# Patient Record
Sex: Female | Born: 1965 | Race: White | Hispanic: No | Marital: Married
Health system: Southern US, Community
[De-identification: ages and names within clinical notes are randomized; demographics above are authoritative.]

---

## 1998-04-28 ENCOUNTER — Other Ambulatory Visit: Admission: RE | Admit: 1998-04-28 | Discharge: 1998-04-28 | Payer: Self-pay | Admitting: Physician Assistant

## 1999-05-16 ENCOUNTER — Other Ambulatory Visit: Admission: RE | Admit: 1999-05-16 | Discharge: 1999-05-16 | Payer: Self-pay | Admitting: Obstetrics and Gynecology

## 2000-08-06 ENCOUNTER — Other Ambulatory Visit: Admission: RE | Admit: 2000-08-06 | Discharge: 2000-08-06 | Payer: Self-pay | Admitting: Obstetrics and Gynecology

## 2001-08-12 ENCOUNTER — Other Ambulatory Visit: Admission: RE | Admit: 2001-08-12 | Discharge: 2001-08-12 | Payer: Self-pay | Admitting: Obstetrics and Gynecology

## 2002-09-15 ENCOUNTER — Other Ambulatory Visit: Admission: RE | Admit: 2002-09-15 | Discharge: 2002-09-15 | Payer: Self-pay | Admitting: Obstetrics and Gynecology

## 2003-09-28 ENCOUNTER — Other Ambulatory Visit: Admission: RE | Admit: 2003-09-28 | Discharge: 2003-09-28 | Payer: Self-pay | Admitting: Obstetrics and Gynecology

## 2004-10-18 ENCOUNTER — Other Ambulatory Visit: Admission: RE | Admit: 2004-10-18 | Discharge: 2004-10-18 | Payer: Self-pay | Admitting: Obstetrics and Gynecology

## 2010-12-11 ENCOUNTER — Encounter: Payer: Self-pay | Admitting: Obstetrics and Gynecology

## 2011-12-13 ENCOUNTER — Other Ambulatory Visit: Payer: Self-pay | Admitting: Obstetrics and Gynecology

## 2011-12-13 DIAGNOSIS — R928 Other abnormal and inconclusive findings on diagnostic imaging of breast: Secondary | ICD-10-CM

## 2011-12-20 ENCOUNTER — Ambulatory Visit
Admission: RE | Admit: 2011-12-20 | Discharge: 2011-12-20 | Disposition: A | Discharge status: Home / Self Care | Payer: BC Managed Care – PPO | Source: Ambulatory Visit | Attending: Obstetrics and Gynecology | Admitting: Obstetrics and Gynecology

## 2011-12-20 DIAGNOSIS — R928 Other abnormal and inconclusive findings on diagnostic imaging of breast: Secondary | ICD-10-CM

## 2012-06-26 ENCOUNTER — Other Ambulatory Visit: Payer: Self-pay | Admitting: Obstetrics and Gynecology

## 2012-06-26 DIAGNOSIS — N63 Unspecified lump in unspecified breast: Secondary | ICD-10-CM

## 2012-08-21 ENCOUNTER — Other Ambulatory Visit: Payer: BC Managed Care – PPO

## 2012-09-20 ENCOUNTER — Other Ambulatory Visit: Payer: BC Managed Care – PPO

## 2012-11-29 ENCOUNTER — Other Ambulatory Visit: Payer: BC Managed Care – PPO

## 2012-12-02 ENCOUNTER — Other Ambulatory Visit: Payer: Self-pay | Admitting: Obstetrics and Gynecology

## 2012-12-02 ENCOUNTER — Ambulatory Visit
Admission: RE | Admit: 2012-12-02 | Discharge: 2012-12-02 | Disposition: A | Discharge status: Home / Self Care | Payer: PRIVATE HEALTH INSURANCE | Source: Ambulatory Visit | Attending: Obstetrics and Gynecology | Admitting: Obstetrics and Gynecology

## 2012-12-02 DIAGNOSIS — N63 Unspecified lump in unspecified breast: Secondary | ICD-10-CM

## 2015-01-10 IMAGING — MG MM DIGITAL DIAGNOSTIC BILAT
4 series · 4 of 4 positions shown · non-contrast
Comparison: With priors

CLINICAL DATA: Follow-up of probable benign lesions in both breast

DIGITAL DIAGNOSTIC BILATERAL MAMMOGRAM WITH CAD

[R CC]
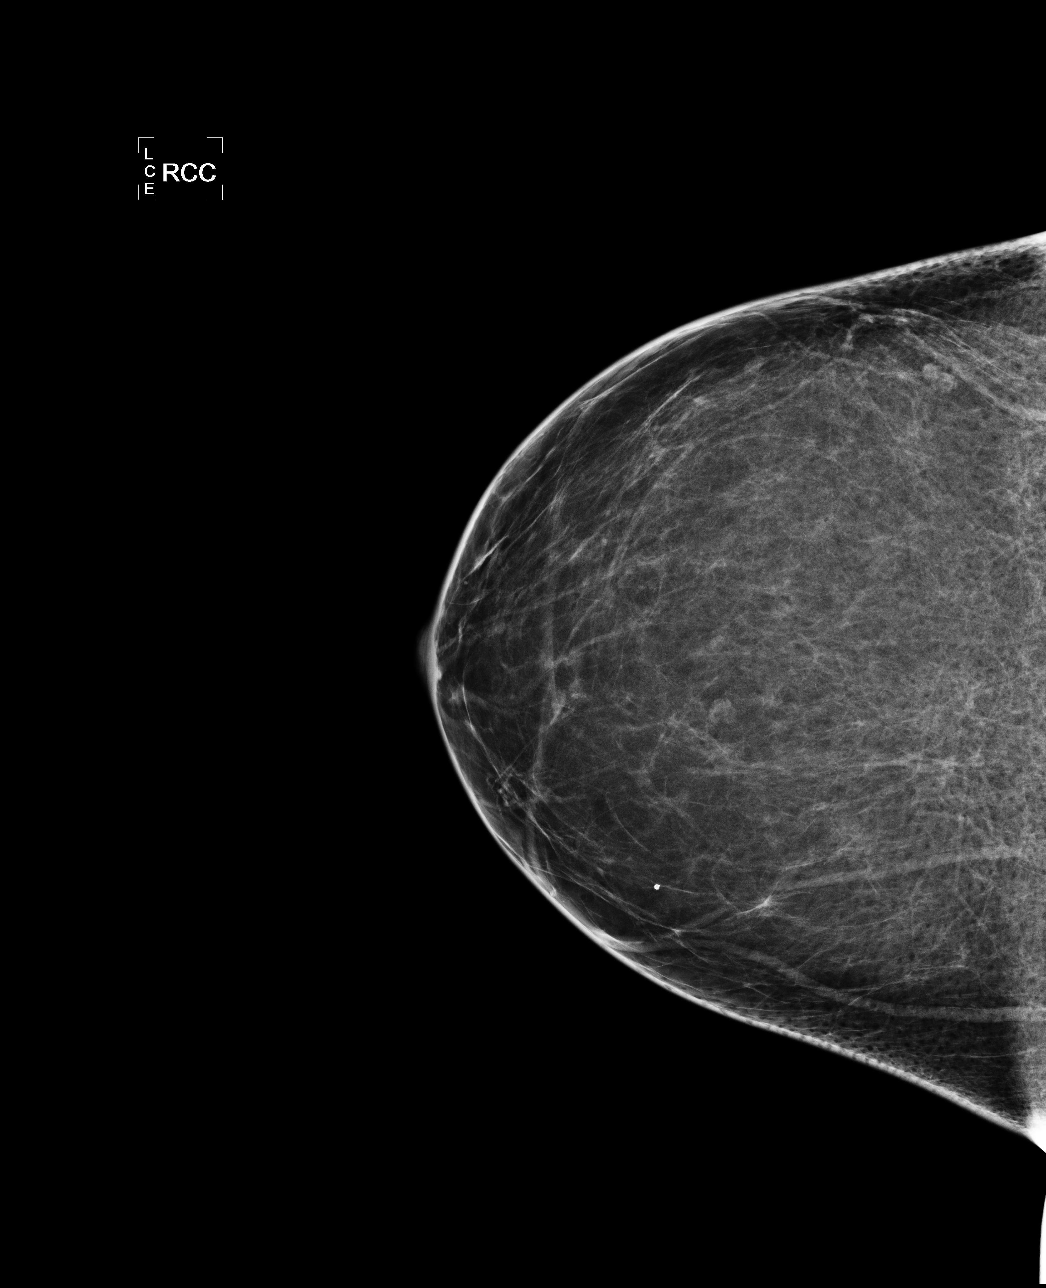

[L CC]
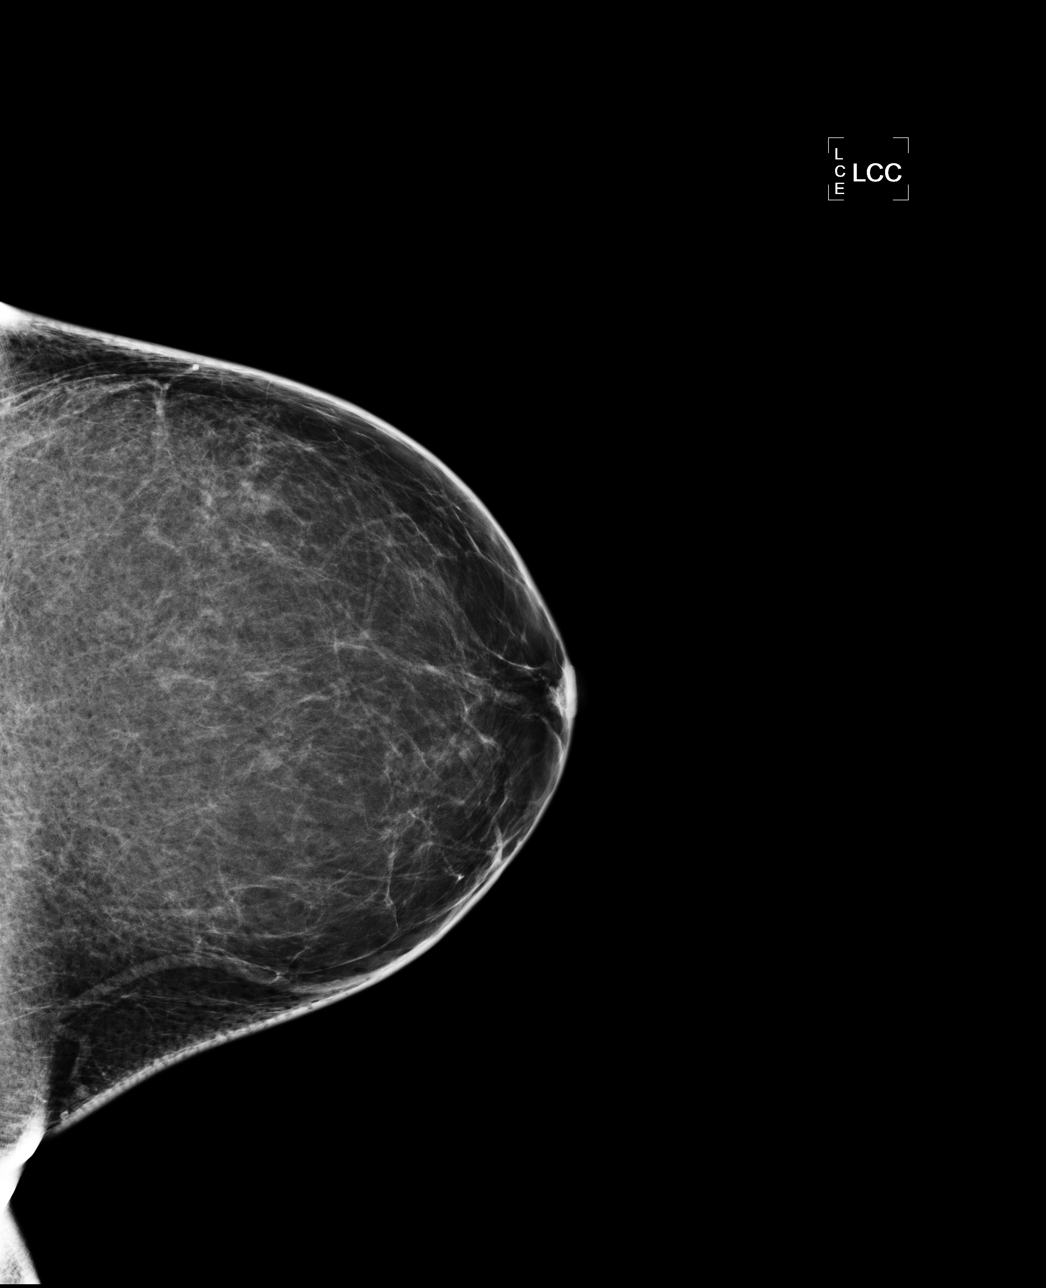

[L MLO]
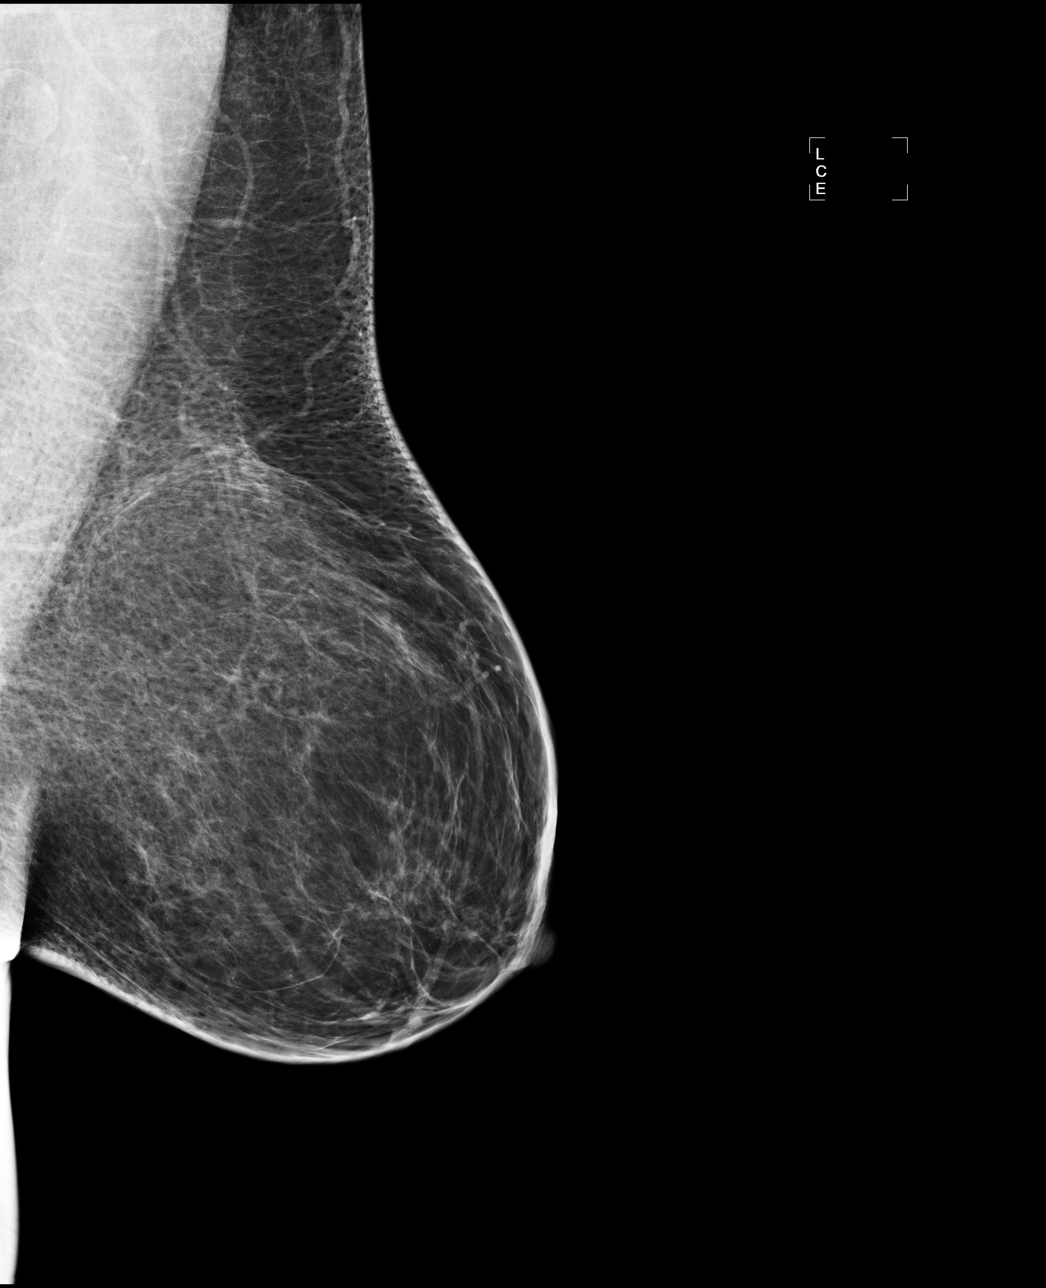

[R MLO]
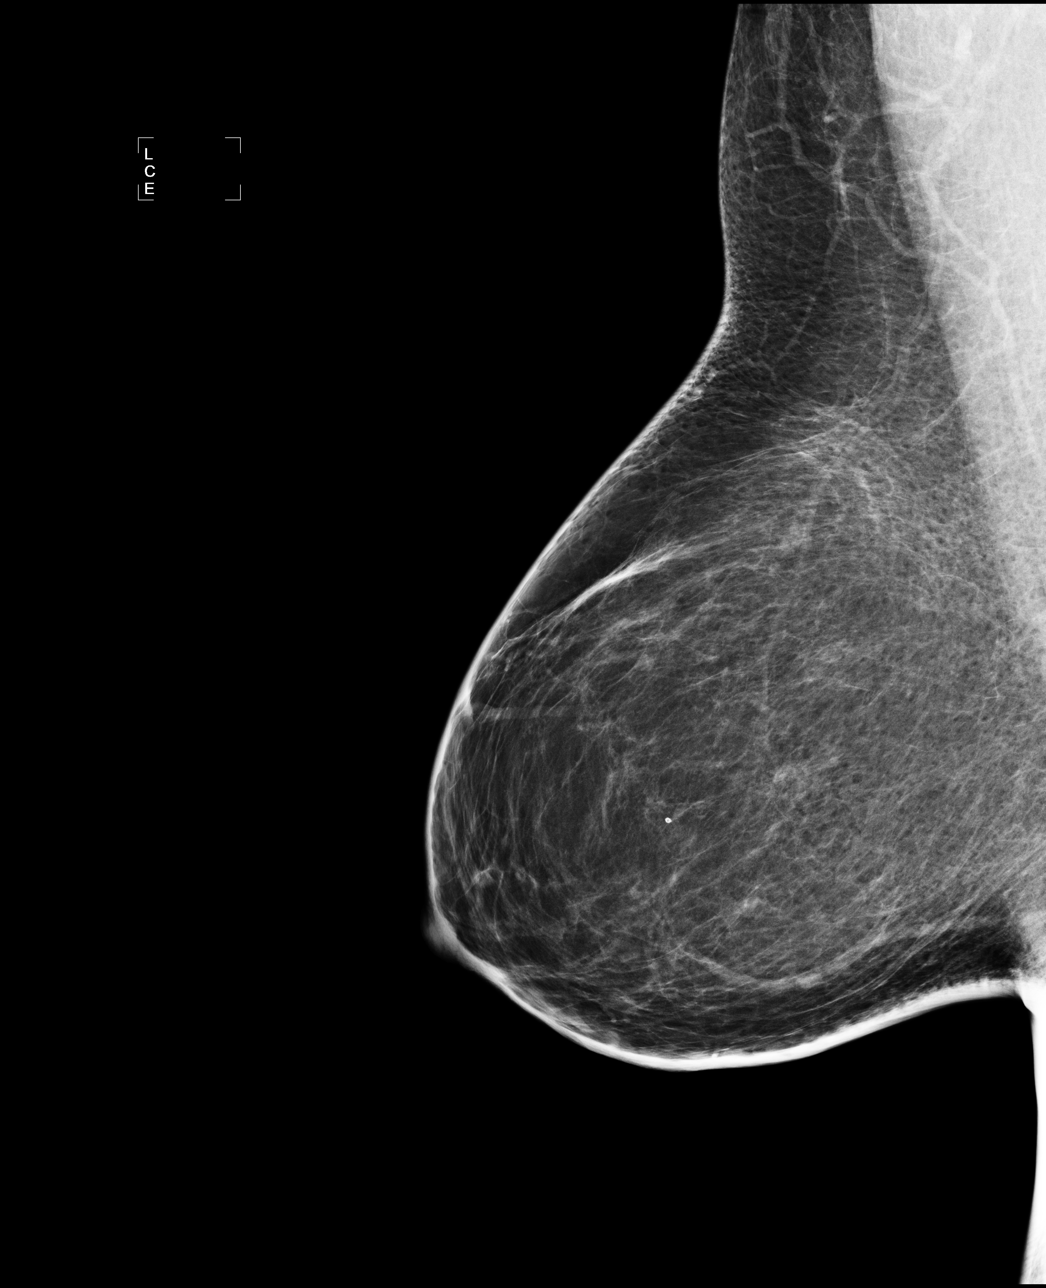

[4 of 4 positions shown; findings below may reference images not displayed]

FINDINGS: ACR Breast Density Category 1: The breast tissue is almost entirely
fatty.

There is no new suspicious mass or malignant-type
microcalcifications in either breast.  The low density nodules
previously described of the medial aspect of the right breast and
lateral aspect of the left breast are less apparent on today's
images.  They have a benign appearance.

Mammographic images were processed with CAD.
IMPRESSION: No evidence of malignancy in either breast.

RECOMMENDATION:
Screening mammogram in 1 year is recommended.

I have discussed the findings and recommendations with the patient.
Results were also provided in writing at the conclusion of the
visit.

BI-RADS CATEGORY 2:  Benign finding(s).

## 2022-01-06 ENCOUNTER — Encounter: Payer: Self-pay | Admitting: Podiatry

## 2022-01-06 ENCOUNTER — Ambulatory Visit (INDEPENDENT_AMBULATORY_CARE_PROVIDER_SITE_OTHER): Payer: BC Managed Care – PPO

## 2022-01-06 ENCOUNTER — Other Ambulatory Visit: Payer: Self-pay

## 2022-01-06 ENCOUNTER — Ambulatory Visit: Payer: BC Managed Care – PPO | Admitting: Podiatry

## 2022-01-06 DIAGNOSIS — G5791 Unspecified mononeuropathy of right lower limb: Secondary | ICD-10-CM

## 2022-01-06 DIAGNOSIS — M79671 Pain in right foot: Secondary | ICD-10-CM | POA: Diagnosis not present

## 2022-01-06 NOTE — Progress Notes (Signed)
°  Subjective:  Patient ID: Suzanne Ward, female    DOB: 1966/10/09,   MRN: [id number]  Chief Complaint  Patient presents with   Foot Pain    Right foot pain    56 y.o. female presents for concern of pain on the top of her right foot. Relates this started a  couple months ago and started in the great toe an moves up toward the ankle. Relates sharp shooting pain that hurts mostly when sitting or laying down. Standing up actually helps the pain. Relates she feels occasional burning and tingling as well. Does have a history of cyst removal in her lower back about 10 years ago. Denies any other pedal complaints. Denies n/v/f/c.   History reviewed. No pertinent past medical history.  Objective:  Physical Exam: Vascular: DP/PT pulses 2/4 bilateral. CFT <3 seconds. Normal hair growth on digits. No edema.  Skin. No lacerations or abrasions bilateral feet.  Musculoskeletal: MMT 5/5 bilateral lower extremities in DF, PF, Inversion and Eversion. Deceased ROM in DF of ankle joint.  Some pain to palpation over anterior ankle No pain along the arch or PT tendon. Negative tinels sign. No pain with ROM of the ankle or foot.  Neurological: Sensation intact to light touch.   Assessment:   1. Neuritis of foot, right      Plan:  Patient was evaluated and treated and all questions answered. Discussed midfoot arthritis with patient and treatment options.  X-rays reviewed. No acute fractures or dislocations. Soft tissue edema, Degenerative changes noted throughout midfoot. Metadductus noted.  Spurring noted at plantar calcaneus.  X-rays reviewed and discussed with patient. Discussed neuritis vs radiculopathy vs tarsal tunnel syndrome.  diagnosis and treatment options with patient. Stretching exercises discussed and handout dispensed. Discussed topical nerve creams  Did discuss getting NCV but patient would like to hold off on that for now.  Discussed if there is no improvement following up with PCP for  possible back etiology  Patient to return to clinic as needed.      Lorenda Peck, DPM

## 2024-02-14 IMAGING — DX DG FOOT COMPLETE 3+V*R*
3 series · 3 of 3 positions shown · non-contrast
Comparison: None.

CLINICAL DATA: No right is of right foot. Right anterior foot pain
from great toe to ankle.

EXAM:
RIGHT FOOT COMPLETE - 3+ VIEW

[foot ap wb]
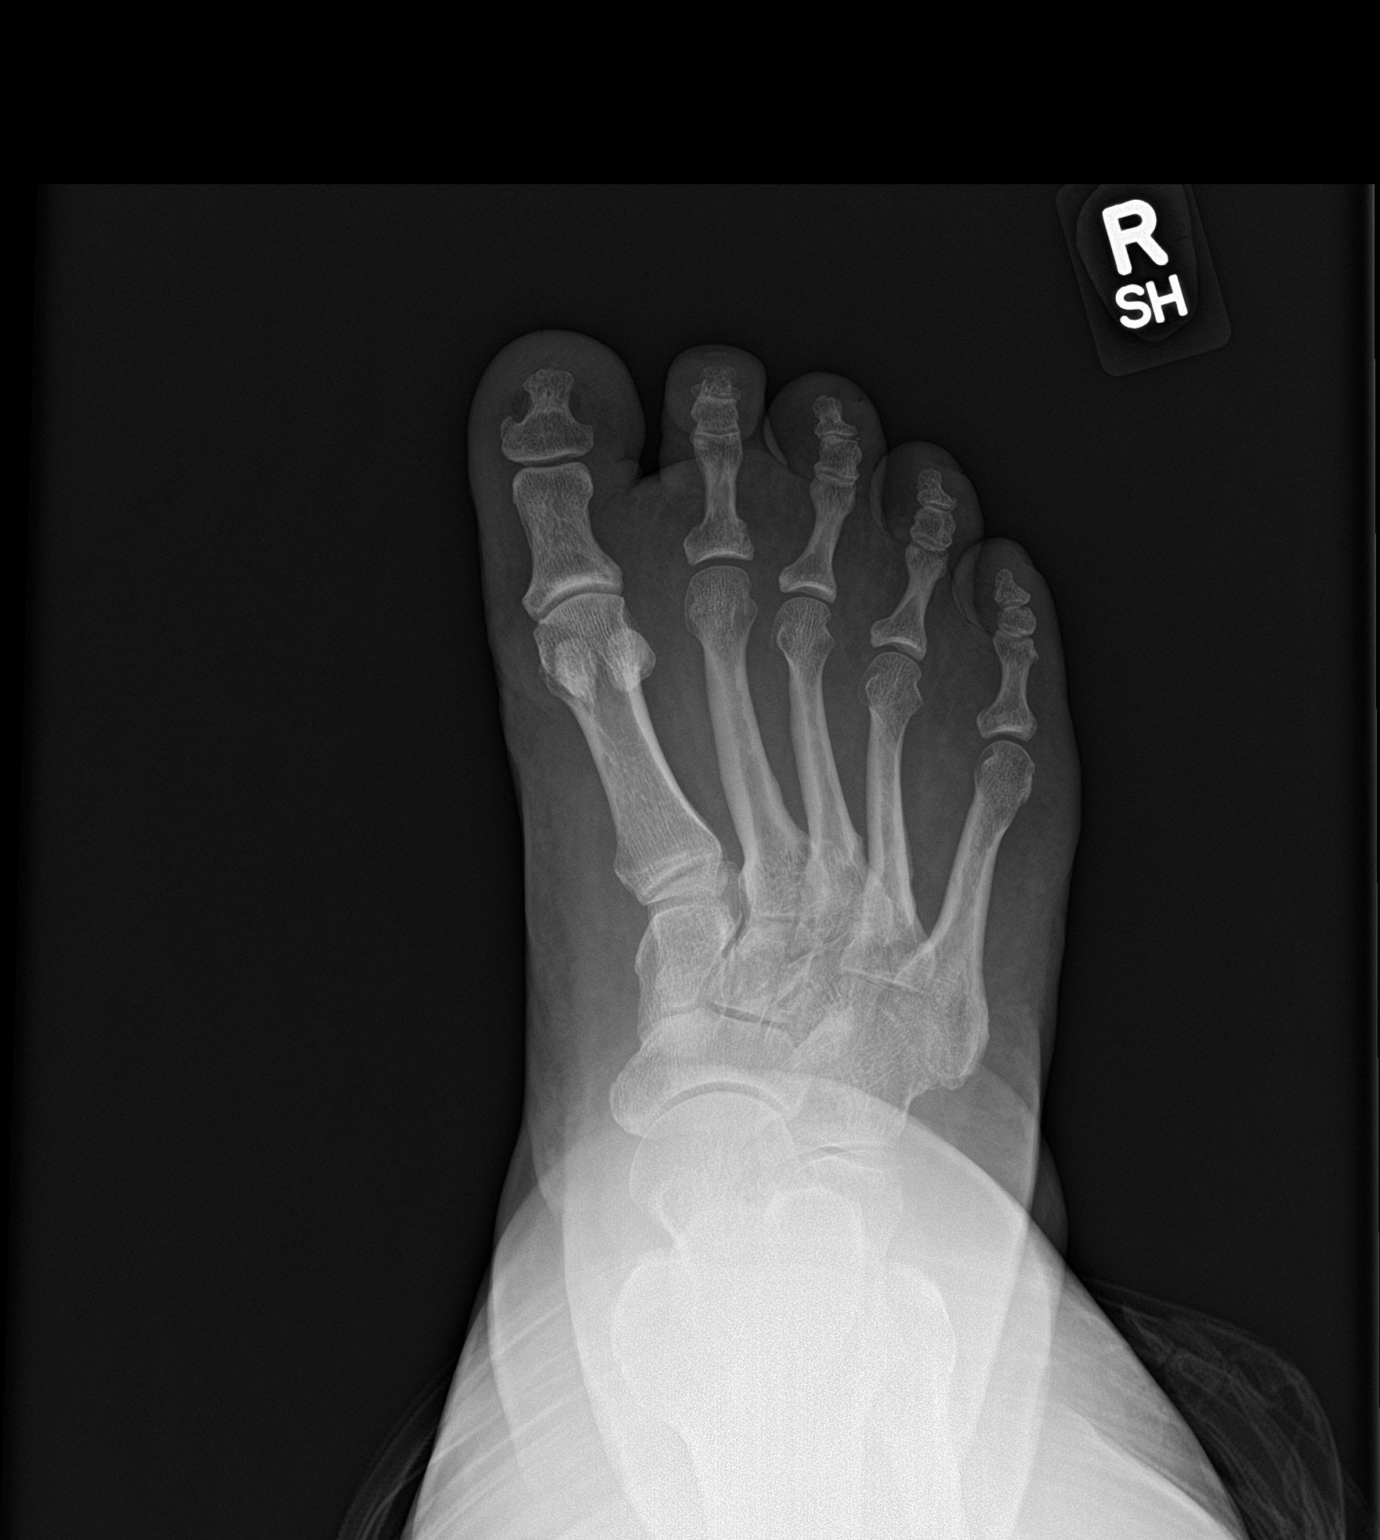

[foot obl wb]
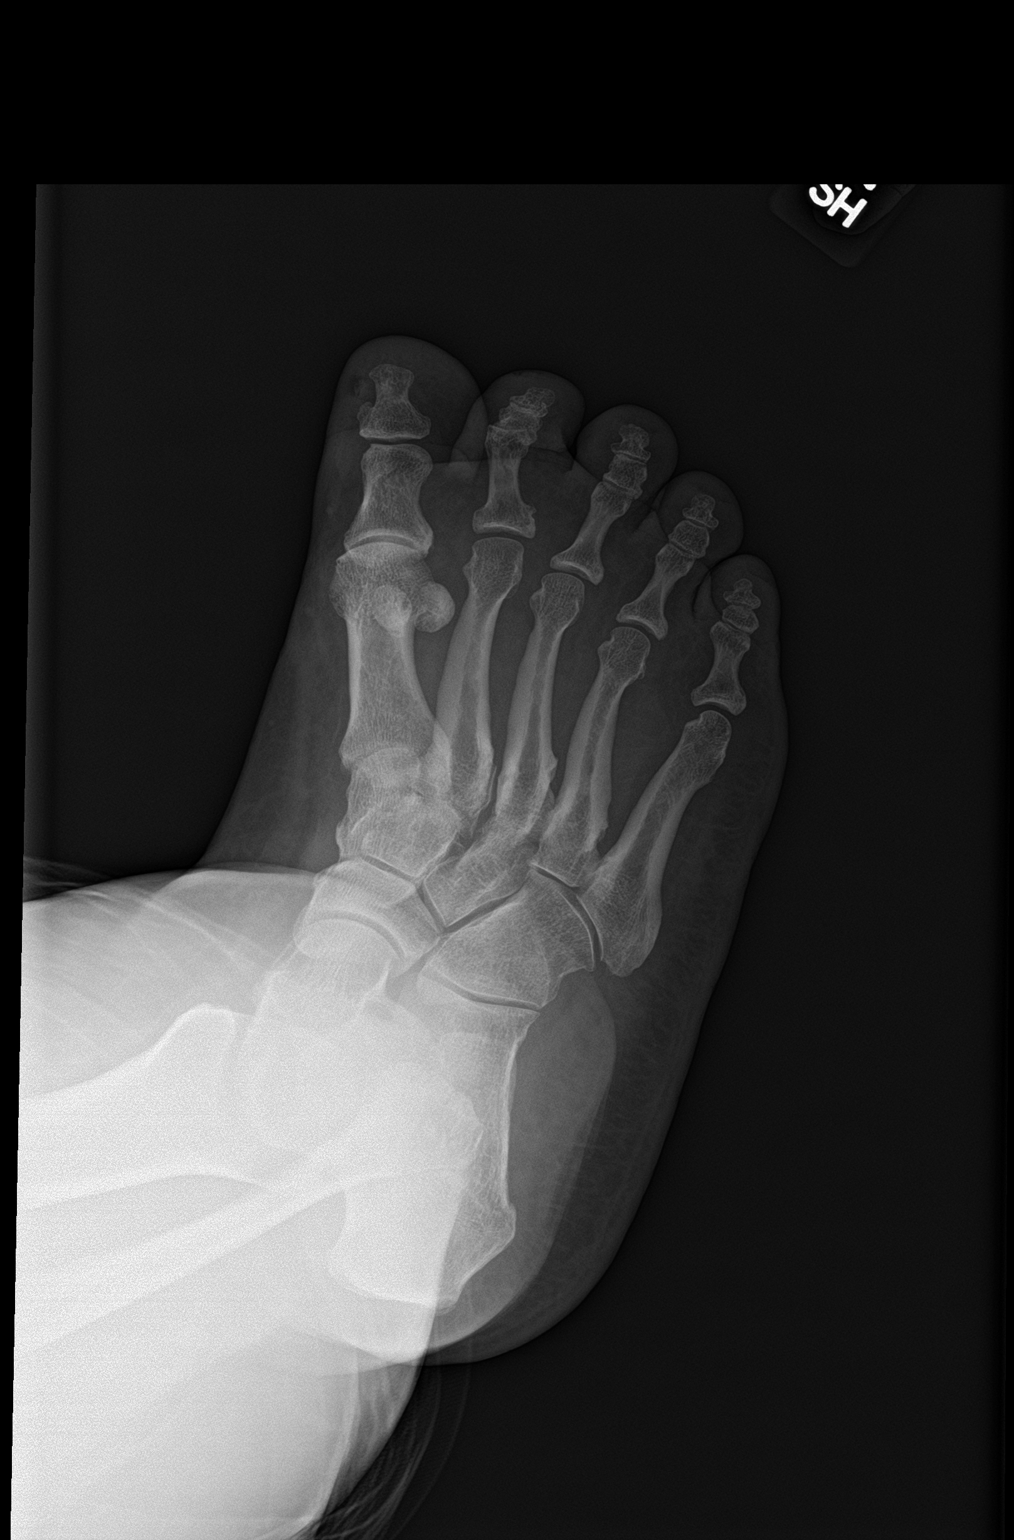

[foot lat wb]
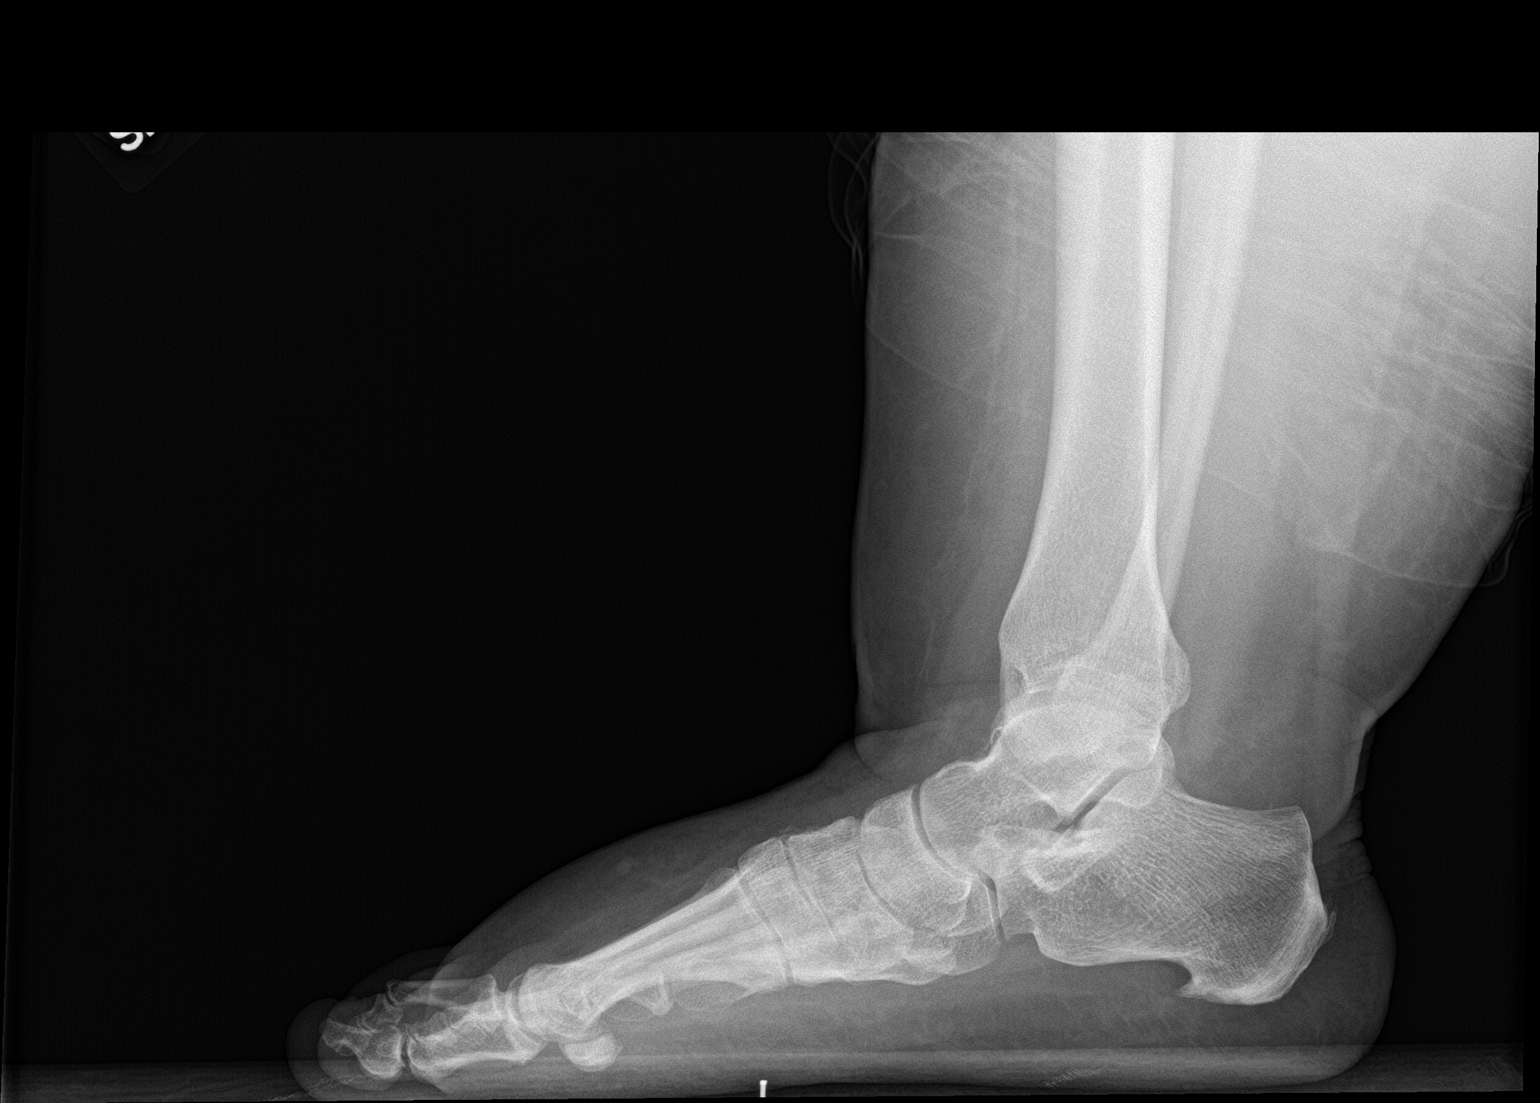

[3 of 3 positions shown; findings below may reference images not displayed]

FINDINGS: Imaging obtained weight-bearing. Normal alignment minimal joint
space narrowing and spurring of the first metatarsal phalangeal
joint. There is a moderate plantar calcaneal spur. No fracture,
erosion, periostitis or bony destruction. Soft tissue prominence
overlying the dorsum of the foot may be edema or related to habitus.
IMPRESSION: 1. Minimal osteoarthritis of the first metatarsophalangeal joint.
2. Moderate plantar calcaneal spur.
